# Patient Record
Sex: Male | Born: 1985 | Race: White | Hispanic: No | Marital: Single | Smoking: Current every day smoker
Health system: Southern US, Community
[De-identification: ages and names within clinical notes are randomized; demographics above are authoritative.]

---
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
## 2010-11-13 ENCOUNTER — Encounter: Payer: Self-pay | Admitting: Family Medicine

## 2015-01-28 ENCOUNTER — Emergency Department (INDEPENDENT_AMBULATORY_CARE_PROVIDER_SITE_OTHER)
Admission: EM | Admit: 2015-01-28 | Discharge: 2015-01-28 | Disposition: A | Discharge status: Home / Self Care | Payer: PRIVATE HEALTH INSURANCE | Source: Home / Self Care | Attending: Family Medicine | Admitting: Family Medicine

## 2015-01-28 ENCOUNTER — Emergency Department (INDEPENDENT_AMBULATORY_CARE_PROVIDER_SITE_OTHER): Payer: PRIVATE HEALTH INSURANCE

## 2015-01-28 DIAGNOSIS — S39012A Strain of muscle, fascia and tendon of lower back, initial encounter: Secondary | ICD-10-CM | POA: Diagnosis not present

## 2015-01-28 DIAGNOSIS — S161XXA Strain of muscle, fascia and tendon at neck level, initial encounter: Secondary | ICD-10-CM

## 2015-01-28 DIAGNOSIS — S40012A Contusion of left shoulder, initial encounter: Secondary | ICD-10-CM

## 2015-01-28 MED ORDER — DICLOFENAC SODIUM 75 MG PO TBEC: 75.0000 mg | DELAYED_RELEASE_TABLET | Freq: Two times a day (BID) | ORAL | Status: DC | PRN

## 2015-01-28 NOTE — Discharge Instructions (Signed)
Thank you for coming in today. Come back or go to the emergency room if you notice new weakness new numbness problems walking or bowel or bladder problems. Follow up with orthopedics and chiropractory as needed.   Back Exercises Back exercises help treat and prevent back injuries. The goal is to increase your strength in your belly (abdominal) and back muscles. These exercises can also help with flexibility. Start these exercises when told by your doctor. HOME CARE Back exercises include: Pelvic Tilt.  Lie on your back with your knees bent. Tilt your pelvis until the lower part of your back is against the floor. Hold this position 5 to 10 sec. Repeat this exercise 5 to 10 times. Knee to Chest.  Pull 1 knee up against your chest and hold for 20 to 30 seconds. Repeat this with the other knee. This may be done with the other leg straight or bent, whichever feels better. Then, pull both knees up against your chest. Sit-Ups or Curl-Ups.  Bend your knees 90 degrees. Start with tilting your pelvis, and do a partial, slow sit-up. Only lift your upper half 30 to 45 degrees off the floor. Take at least 2 to 3 seonds for each sit-up. Do not do sit-ups with your knees out straight. If partial sit-ups are difficult, simply do the above but with only tightening your belly (abdominal) muscles and holding it as told. Hip-Lift.  Lie on your back with your knees flexed 90 degrees. Push down with your feet and shoulders as you raise your hips 2 inches off the floor. Hold for 10 seconds, repeat 5 to 10 times. Back Arches.  Lie on your stomach. Prop yourself up on bent elbows. Slowly press on your hands, causing an arch in your low back. Repeat 3 to 5 times. Shoulder-Lifts.  Lie face down with arms beside your body. Keep hips and belly pressed to floor as you slowly lift your head and shoulders off the floor. Do not overdo your exercises. Be careful in the beginning. Exercises may cause you some mild back  discomfort. If the pain lasts for more than 15 minutes, stop the exercises until you see your doctor. Improvement with exercise for back problems is slow.  Document Released: 11/11/2010 Document Revised: 01/01/2012 Document Reviewed: 08/10/2011 [facility] Patient Information 2015 [place] [employer], [place]. This information is not intended to replace advice given to you by your health care provider. Make sure you discuss any questions you have with your health care provider.

## 2015-01-28 NOTE — ED Notes (Signed)
Pt triaged and assessed by provider.   Provider in before nurse. 
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
## 2022-02-19 ENCOUNTER — Encounter (HOSPITAL_COMMUNITY): Payer: Self-pay | Admitting: Emergency Medicine

## 2022-02-19 ENCOUNTER — Ambulatory Visit (HOSPITAL_COMMUNITY)
Admission: EM | Admit: 2022-02-19 | Discharge: 2022-02-19 | Disposition: A | Discharge status: Home / Self Care | Payer: PRIVATE HEALTH INSURANCE | Attending: Family Medicine | Admitting: Family Medicine

## 2022-02-19 DIAGNOSIS — J4541 Moderate persistent asthma with (acute) exacerbation: Secondary | ICD-10-CM

## 2022-02-19 MED ORDER — ALBUTEROL SULFATE HFA 108 (90 BASE) MCG/ACT IN AERS: 2.0000 | INHALATION_SPRAY | RESPIRATORY_TRACT | 0 refills | Status: AC | PRN

## 2022-02-19 MED ORDER — PREDNISONE 20 MG PO TABS: ORAL_TABLET | ORAL | 0 refills | Status: DC

## 2022-02-19 NOTE — ED Provider Notes (Signed)
?[facility] ? ? ? ?CSN: [id number] ?Arrival date & time: 02/19/22  1649 ? ? ?  ? ?History   ?Chief Complaint ?Chief Complaint  ?Patient presents with  ? Asthma exacerbation   ? ? ?HPI ?Antonio Cuevas is a 36 y.o. male.  ? ?Patient is here for asthma exacerbation.  ?He does have asthma.  He started with worsening symptoms about 3 days ago with severe cough, wheezing.  Worse in the morning and evening. He has a rescue inhaler, but using this more than he should.  He had a bad attack today during the day.  ?He is short on money. As a result he declines a nebulizer today.  ?He is requesting a refill of the inhaler, and a prednisone (at least 7 day).  ?Using mucinex without much help.  ?No fevers/chills.  No n/v.  ? ?History reviewed. No pertinent past medical history. ? ?There are no problems to display for this patient. ? ? ?History reviewed. No pertinent surgical history. ? ? ? ? ?Home Medications   ? ?Prior to Admission medications   ?Medication Sig Start Date End Date Taking? Authorizing Provider  ?diclofenac (VOLTAREN) 75 MG EC tablet Take 1 tablet (75 mg total) by mouth 2 (two) times daily as needed. 01/28/15   Rodolph Bong, MD  ? ? ?Family History ?History reviewed. No pertinent family history. ? ?Social History ?Social History  ? ?Tobacco Use  ? Smoking status: Former  ?  Types: Cigarettes  ? Smokeless tobacco: Never  ?Vaping Use  ? Vaping Use: Every day  ? ? ? ?Allergies   ?Patient has no known allergies. ? ? ?Review of Systems ?Review of Systems  ?Constitutional: Negative.   ?HENT: Negative.    ?Respiratory:  Positive for cough, shortness of breath and wheezing.   ?Cardiovascular: Negative.   ?Gastrointestinal: Negative.   ?Genitourinary: Negative.   ? ? ?Physical Exam ?Triage Vital Signs ?ED Triage Vitals  ?Enc Vitals Group  ?   BP 02/19/22 1707 115/61  ?   Pulse Rate 02/19/22 1707 74  ?   Resp 02/19/22 1707 (!) 22  ?   Temp 02/19/22 1707 97.8 ?F (36.6 ?C)  ?   Temp Source 02/19/22 1707 Oral  ?    SpO2 02/19/22 1707 97 %  ?   Weight 02/19/22 1704 170 lb (77.1 kg)  ?   Height 02/19/22 1704 5\' 6"  (1.676 m)  ?   Head Circumference --   ?   Peak Flow --   ?   Pain Score 02/19/22 1704 0  ?   Pain Loc --   ?   Pain Edu? --   ?   Excl. in GC? --   ? ?No data found. ? ?Updated Vital Signs ?BP 115/61 (BP Location: Left Arm)   Pulse 74   Temp 97.8 ?F (36.6 ?C) (Oral)   Resp (!) 22   Ht 5\' 6"  (1.676 m)   Wt 77.1 kg   SpO2 97%   BMI 27.44 kg/m?  ? ?Visual Acuity ?Right Eye Distance:   ?Left Eye Distance:   ?Bilateral Distance:   ? ?Right Eye Near:   ?Left Eye Near:    ?Bilateral Near:    ? ?Physical Exam ?Constitutional:   ?   General: He is not in acute distress. ?   Appearance: Normal appearance. He is not ill-appearing.  ?HENT:  ?   Head: Normocephalic.  ?   Nose: Nose normal.  ?Cardiovascular:  ?  Rate and Rhythm: Normal rate.  ?Pulmonary:  ?   Effort: Pulmonary effort is normal.  ?   Breath sounds: Wheezing present.  ?   Comments: Scattered wheezes throughout the lung space ?Musculoskeletal:  ?   Cervical back: Normal range of motion and neck supple.  ?Neurological:  ?   Mental Status: He is alert.  ? ? ? ?UC Treatments / Results  ?Labs ?(all labs ordered are listed, but only abnormal results are displayed) ?Labs Reviewed - No data to display ? ?EKG ? ? ?Radiology ?No results found. ? ?Procedures ?Procedures (including critical care time) ? ?Medications Ordered in UC ?Medications - No data to display ? ?Initial Impression / Assessment and Plan / UC Course  ?I have reviewed the triage vital signs and the nursing notes. ? ?Pertinent labs & imaging results that were available during my care of the patient were reviewed by me and considered in my medical decision making (see chart for details). ? ?Patient seen today for asthma exacerbation.  ?Vitals normal, does not appear in distress, but does have audible wheezing.  He declines a neb treatment today due to cost.  He is requesting only prednisone and a refill  of the albuterol.  ?Discussed a nebulizer, but he wants to check cost of other places before getting one here.  ? ?  ?Final Clinical Impressions(s) / UC Diagnoses  ? ?Final diagnoses:  ?Moderate persistent asthma with acute exacerbation  ? ? ? ?Discharge Instructions   ? ?  ?You were seen today for asthma exacerbation. ?I have sent out prednisone and an albuterol inhaler.  ?If you have worsening symptoms despite medication then please go to the ER for further evaluation.  ? ? ? ?ED Prescriptions   ? ? Medication Sig Dispense Auth. Provider  ? albuterol (VENTOLIN HFA) 108 (90 Base) MCG/ACT inhaler Inhale 2 puffs into the lungs every 4 (four) hours as needed for wheezing or shortness of breath. 1 each Jannifer Franklin, MD  ? predniSONE (DELTASONE) 20 MG tablet 3 tabs daily x 3 days, 2 tabs daily x 3 days, 1 tab daily x 3 days, 1/2 tab daily x 3 days 30 tablet Jannifer Franklin, MD  ? ?  ? ?PDMP not reviewed this encounter. ?  ?Jannifer Franklin, MD ?02/19/22 1734 ? ?

## 2022-02-19 NOTE — Discharge Instructions (Signed)
You were seen today for asthma exacerbation. ?I have sent out prednisone and an albuterol inhaler.  ?If you have worsening symptoms despite medication then please go to the ER for further evaluation.  ?

## 2022-02-19 NOTE — ED Triage Notes (Addendum)
Pt reports shortness of breath and wheezing. States he has been using his inhaler too much. Just got rx for inhaler 2 days ago and already have 50 puffs left. States normally he is prescribed 7 day taper dose of prednisone and z-pack. Pt able to speak in full sentences and sating 99% on room air.  ?

## 2023-07-22 ENCOUNTER — Emergency Department (HOSPITAL_COMMUNITY): Payer: Self-pay

## 2023-07-22 ENCOUNTER — Other Ambulatory Visit: Payer: Self-pay

## 2023-07-22 ENCOUNTER — Emergency Department (HOSPITAL_COMMUNITY)
Admission: EM | Admit: 2023-07-22 | Discharge: 2023-07-25 | Disposition: A | Discharge status: Home / Self Care | Payer: Self-pay | Attending: Emergency Medicine | Admitting: Emergency Medicine

## 2023-07-22 DIAGNOSIS — R079 Chest pain, unspecified: Secondary | ICD-10-CM | POA: Insufficient documentation

## 2023-07-22 DIAGNOSIS — F19959 Other psychoactive substance use, unspecified with psychoactive substance-induced psychotic disorder, unspecified: Secondary | ICD-10-CM

## 2023-07-22 DIAGNOSIS — R000 Tachycardia, unspecified: Secondary | ICD-10-CM | POA: Insufficient documentation

## 2023-07-22 DIAGNOSIS — R0682 Tachypnea, not elsewhere classified: Secondary | ICD-10-CM | POA: Insufficient documentation

## 2023-07-22 DIAGNOSIS — F29 Unspecified psychosis not due to a substance or known physiological condition: Secondary | ICD-10-CM | POA: Insufficient documentation

## 2023-07-22 DIAGNOSIS — F19150 Other psychoactive substance abuse with psychoactive substance-induced psychotic disorder with delusions: Secondary | ICD-10-CM | POA: Insufficient documentation

## 2023-07-22 DIAGNOSIS — Z87891 Personal history of nicotine dependence: Secondary | ICD-10-CM | POA: Insufficient documentation

## 2023-07-22 DIAGNOSIS — F22 Delusional disorders: Secondary | ICD-10-CM

## 2023-07-22 LAB — CBC
HCT: 43 % (ref 39.0–52.0)
Hemoglobin: 14 g/dL (ref 13.0–17.0)
MCH: 30 pg (ref 26.0–34.0)
MCHC: 32.6 g/dL (ref 30.0–36.0)
MCV: 92.1 fL (ref 80.0–100.0)
Platelets: 304 10*3/uL (ref 150–400)
RBC: 4.67 MIL/uL (ref 4.22–5.81)
RDW: 11.5 % (ref 11.5–15.5)
WBC: 7.4 10*3/uL (ref 4.0–10.5)
nRBC: 0 % (ref 0.0–0.2)

## 2023-07-22 LAB — URINALYSIS, ROUTINE W REFLEX MICROSCOPIC
Bilirubin Urine: NEGATIVE
Glucose, UA: NEGATIVE mg/dL
Hgb urine dipstick: NEGATIVE
Ketones, ur: NEGATIVE mg/dL
Leukocytes,Ua: NEGATIVE
Nitrite: NEGATIVE
Protein, ur: NEGATIVE mg/dL
Specific Gravity, Urine: 1.006 (ref 1.005–1.030)
pH: 7 (ref 5.0–8.0)

## 2023-07-22 LAB — BASIC METABOLIC PANEL
Anion gap: 13 (ref 5–15)
BUN: 7 mg/dL (ref 6–20)
CO2: 25 mmol/L (ref 22–32)
Calcium: 9.5 mg/dL (ref 8.9–10.3)
Chloride: 101 mmol/L (ref 98–111)
Creatinine, Ser: 1.01 mg/dL (ref 0.61–1.24)
GFR, Estimated: >60 mL/min (ref >60)
Glucose, Bld: 123 mg/dL — ABNORMAL HIGH (ref 70–99)
Potassium: 3.5 mmol/L (ref 3.5–5.1)
Sodium: 139 mmol/L (ref 135–145)

## 2023-07-22 LAB — RAPID URINE DRUG SCREEN, HOSP PERFORMED
Amphetamines: POSITIVE — AB
Barbiturates: NOT DETECTED
Benzodiazepines: NOT DETECTED
Cocaine: NOT DETECTED
Opiates: NOT DETECTED
Tetrahydrocannabinol: POSITIVE — AB

## 2023-07-22 LAB — SALICYLATE LEVEL: Salicylate Lvl: <7 mg/dL — ABNORMAL LOW (ref 7.0–30.0)

## 2023-07-22 LAB — TSH: TSH: 1.781 u[IU]/mL (ref 0.350–4.500)

## 2023-07-22 LAB — TROPONIN I (HIGH SENSITIVITY)
Troponin I (High Sensitivity): 3 ng/L (ref <18)
Troponin I (High Sensitivity): 3 ng/L (ref <18)

## 2023-07-22 LAB — ACETAMINOPHEN LEVEL: Acetaminophen (Tylenol), Serum: <10 ug/mL — ABNORMAL LOW (ref 10–30)

## 2023-07-22 LAB — ETHANOL: Alcohol, Ethyl (B): <10 mg/dL (ref <10)

## 2023-07-22 MED ORDER — LORAZEPAM 1 MG PO TABS: 1.0000 mg | ORAL_TABLET | ORAL | Status: DC | PRN

## 2023-07-22 MED ORDER — DROPERIDOL 2.5 MG/ML IJ SOLN
2.5000 mg | Freq: Once | INTRAMUSCULAR | Status: AC
  Administered 2023-07-22: 2.5 mg via INTRAVENOUS
  Filled 2023-07-22: qty 2

## 2023-07-22 MED ORDER — HYDROXYZINE HCL 25 MG PO TABS: 25.0000 mg | ORAL_TABLET | Freq: Three times a day (TID) | ORAL | Status: DC | PRN

## 2023-07-22 MED ORDER — OLANZAPINE 5 MG PO TBDP: 10.0000 mg | ORAL_TABLET | Freq: Three times a day (TID) | ORAL | Status: DC | PRN

## 2023-07-22 MED ORDER — SODIUM CHLORIDE 0.9 % IV BOLUS
1000.0000 mL | Freq: Once | INTRAVENOUS | Status: AC
  Administered 2023-07-22: 1000 mL via INTRAVENOUS

## 2023-07-22 MED ORDER — ZIPRASIDONE MESYLATE 20 MG IM SOLR: 20.0000 mg | INTRAMUSCULAR | Status: DC | PRN

## 2023-07-22 MED ORDER — LORAZEPAM 1 MG PO TABS: 1.0000 mg | ORAL_TABLET | Freq: Once | ORAL | Status: DC

## 2023-07-22 MED ORDER — OLANZAPINE 5 MG PO TBDP
10.0000 mg | ORAL_TABLET | Freq: Once | ORAL | Status: AC
  Administered 2023-07-22: 10 mg via ORAL
  Filled 2023-07-22: qty 2

## 2023-07-22 NOTE — ED Notes (Signed)
Pt provided water.  

## 2023-07-22 NOTE — ED Notes (Signed)
Per staffing, no 1:1 at this time but may have one at 1500; ED charge RN, Asher Muir, made aware of staffing shortage.

## 2023-07-22 NOTE — ED Notes (Signed)
Upon entering room pt was removing monitoring cords and trying to figure out why the monitor was alarming. RN informed pt the monitoring cords are not in place correctly. RN applied monitoring cords. When getting BP pt pull cuff off and stated it makes it feel like his head is going to explode. Pt agreed to have BP cuff placed on left wrist.

## 2023-07-22 NOTE — ED Notes (Signed)
Pt returned from restroom stating he feels better. RN and mom redirected pt to agree to stay and start IV fluids.

## 2023-07-22 NOTE — ED Notes (Signed)
Pt agreed to complete CT scan

## 2023-07-22 NOTE — ED Notes (Signed)
Pt says he has blood rushing to his right leg and heart beating fast that started about 5 am.

## 2023-07-22 NOTE — ED Notes (Signed)
RN changed pt out in purple scrubs. RN gave pt MOM belongings which included burgundy sweatshirt, olive green pants, black shoes, socks, and orange vape.
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
## 2023-07-22 NOTE — ED Notes (Signed)
Pt mom says she will stay in lobby d/t

## 2023-07-22 NOTE — ED Notes (Signed)
RN explained the IVC process with sister. Sister is assisting with keeping pt calm and redirecting to stay at the hospital at this time.

## 2023-07-22 NOTE — ED Notes (Signed)
Pt provided a breakfast tray.

## 2023-07-22 NOTE — Consult Note (Signed)
Telepsych Consultation   Reason for Consult:  "Psych Consult" Referring Physician:  [place]  Location of Patient:    [facility] Location of Provider: Other: virtual home office  Patient Identification: Antonio Cuevas MRN:  [id number] Principal Diagnosis: Substance-induced psychotic disorder [facility]) Diagnosis:  Principal Problem:   Substance-induced psychotic disorder (HCC)   Total Time spent with patient: 45 minutes  Subjective:   Antonio Cuevas is a 37 y.o. male patient admitted with  Per RN Triage Note 07/21/2028@0618 : "Patient reports palpitations with central chest discomfort this morning , he adds headache with right lower leg pressure, denies SOB "  Per IVC" "Patient has hx of anxiety, depression and undiagnosed psychotic disorder and during last episode of psychosis patient listened to voices in his head telling him to cut his writsts and question drug use; patient endorses acid flashbacks."  HPI:   Antonio Cuevas, 37 y.o., male patient presented to the emergency department for evaluation of chest pain; found to be uncooperative and nervous, after receiving collateral from his family that last time patient has similar behavior he was hearing voices telling him to cut his wrists; patient was placed under IVC and psychiatry consult was ordered.  Patient seen via telepsych by this provider; chart reviewed and consulted with Dr. Sherron Flemings on 07/22/23.  On evaluation Antonio Cuevas is observed sitting in an exam room, alone.  He is fairly groomed, wearing hospital scrubs and has a blanket draped around him.  Pt greeted and given anticipatory guidance by this Clinical research associate.  He does not appear interested in what's being said, but is preoccupied with his blanket. Patient constantly fidgeting with his blanket, then turns his attention to his arm, and begins picking at the area where he had blood drawn.  When asked if his arm bothers him, pt extends his right arm and  states, "see, nothing wrong with it."  He denies pain or anything this writer could address but remains focused on fidgeting with various items of clothing as well.  When asked if he's nervous he states maybe his legs are nervous because he can't stop moving them.  Patient is alert and oriented x2; he is not sure of the time and states he stopped keeping track of the date and time a while ago because he no longer found it useful.  He does not elaborate; when asked what's meant by the statement, he's observed looking around the room.    Patient reports a hx for anxiety but states he is undiagnosed and is not being followed by outpatient mental health.  He states his PCP prescribes amphetamine/dextroamphetamine XR 30mg  po daily for adhd.  He reports he take the medication as prescribed and denies taking more; Pt states he believes he has a full bottle at home. Per PDMP medication was last filled on 07/05/2023 for 30 days supply.  He states he barely ever drinks alcohol but occasionally may have a beer or so; he denies street drug usage.  He then announces his uncomfortable in his seat and stands, looks around the room before returning to sit. He denies SI/HI/AVH, he denies self injurious behaviors or prior suicide attempt.  He denies prior psychiatric hospitalization.  Per IVC, pt has hx of hearing voices telling him to cut his wrist, wen directly asked about this, he pauses before saying "that's not true."   Patient states he is single and currently lives with sister; States he's not working but depends on his mother and  sister to care for him.   He reports over the past few weeks he's had difficulty sleeping; he states he eats a few times a day. He denies concerns this Clinical research associate could address.    Labs: CMP is within normal limits except glucose which is elevated at 123mg /dl-non-fasting labs CBC does not show anemia or leukocytosis UDS is + for amphetamines and thc; pt receives prescribed adderall EKG: QT/QTC  intervals 367/439 Head CT IMPRESSION: Normal noncontrast Head CT.   During evaluation Antonio Cuevas is sitting on chair in exam room; He is fairly groomed wearing hospital scrubs; He is alert/oriented x 2; appears nervous and confused, attempts to cooperate but is limited by his mental state; and mood congruent with affect.  Patient is speaking in a clear tone at moderate volume, and normal pace; with fair eye contact.  His thought process is relevant; Patient appears to be responding to internal/external stimuli, has poor judgment and insight. Patient denies suicidal/self-harm/homicidal ideation Patient has remained calm throughout assessment and has answered questions appropriately.    Per Ed Provider Admission Assessment 07/22/2023@0609 : History       Chief Complaint  Patient presents with   Palpitations   Chest Pain      Headache      Antonio Cuevas is a 37 y.o. male past medical history seen for anxiety, depression who presents with concern for central chest discomfort, palpitations, endorses some right lower leg pressure, headache, denies leg swelling, previous history of blood clot, hemoptysis.  Patient is quite anxious, refusing EKG on my initial evaluation, anxious, pressured speech. Per sister no previous hx of drug use, bipolar, schizophrenia.    Past Psychiatric History: ADHD,   Risk to Self:  yes d/t decompensated mental state Risk to Others:  yes, there is potential d/t decompensated mental state Prior Inpatient Therapy:  denies Prior Outpatient Therapy:  denies  Past Medical History: No past medical history on file. No past surgical history on file. Family History: No family history on file. Family Psychiatric  History: denies Social History:  Social History   Substance and Sexual Activity  Alcohol Use None     Social History   Substance and Sexual Activity  Drug Use Not on file    Social History   Socioeconomic History   Marital status: Single     Spouse name: Not on file   Number of children: Not on file   Years of education: Not on file   Highest education level: Not on file  Occupational History   Not on file  Tobacco Use   Smoking status: Former    Types: Cigarettes   Smokeless tobacco: Never  Vaping Use   Vaping status: Every Day  Substance and Sexual Activity   Alcohol use: Not on file   Drug use: Not on file   Sexual activity: Not on file  Other Topics Concern   Not on file  Social History Narrative   Not on file   Social Determinants of Health   Financial Resource Strain: Not on File (02/09/2022)   Received from [facility]    Financial Resource Strain: 0  Food Insecurity: Not on File (07/19/2023)   Received from [facility] Insecurity    Food: 0  Transportation Needs: Not on File (02/09/2022)   Received from [employer] Needs    Transportation: 0  Physical Activity: Not on File (02/09/2022)   Received from [facility]   Physical Activity  Physical Activity: 0  Stress: Not on File (02/09/2022)   Received from [facility]   Stress    Stress: 0  Social Connections: Not on File (07/07/2023)   Received from [facility]   Social Connections    Connectedness: 0   Additional Social History:    Allergies:   Allergies  Allergen Reactions   Penicillins     Other Reaction(s): childhood    Labs:  Results for orders placed or performed during the hospital encounter of 07/22/23 (from the past 48 hour(s))  Basic metabolic panel     Status: Abnormal   Collection Time: 07/22/23  6:22 AM  Result Value Ref Range   Sodium 139 135 - 145 mmol/L   Potassium 3.5 3.5 - 5.1 mmol/L   Chloride 101 98 - 111 mmol/L   CO2 25 22 - 32 mmol/L   Glucose, Bld 123 (H) 70 - 99 mg/dL    Comment: Glucose reference range applies only to samples taken after fasting for at least 8 hours.   BUN 7 6 - 20 mg/dL   Creatinine, Ser 1.61 0.61 - 1.24 mg/dL   Calcium 9.5 8.9 - 09.6 mg/dL   GFR, Estimated >04 >54 mL/min     Comment: (NOTE) Calculated using the CKD-EPI Creatinine Equation (2021)    Anion gap 13 5 - 15    Comment: Performed at [facility], [address], [place], [place] [address]  CBC     Status: None   Collection Time: 07/22/23  6:22 AM  Result Value Ref Range   WBC 7.4 4.0 - 10.5 K/uL   RBC 4.67 4.22 - 5.81 MIL/uL   Hemoglobin 14.0 13.0 - 17.0 g/dL   HCT 91.4 78.2 - 95.6 %   MCV 92.1 80.0 - 100.0 fL   MCH 30.0 26.0 - 34.0 pg   MCHC 32.6 30.0 - 36.0 g/dL   RDW 21.3 08.6 - 57.8 %   Platelets 304 150 - 400 K/uL   nRBC 0.0 0.0 - 0.2 %    Comment: Performed at [facility], [address], [place], [place] [address]  Troponin I (High Sensitivity)     Status: None   Collection Time: 07/22/23  6:22 AM  Result Value Ref Range   Troponin I (High Sensitivity) 3 <18 ng/L    Comment: (NOTE) Elevated high sensitivity troponin I (hsTnI) values and significant  changes across serial measurements may suggest ACS but many other  chronic and acute conditions are known to elevate hsTnI results.  Refer to the "Links" section for chest pain algorithms and additional  guidance. Performed at [facility], [address], [place], [place] [address]   Rapid urine drug screen (hospital performed)     Status: Abnormal   Collection Time: 07/22/23  8:16 AM  Result Value Ref Range   Opiates NONE DETECTED NONE DETECTED   Cocaine NONE DETECTED NONE DETECTED   Benzodiazepines NONE DETECTED NONE DETECTED   Amphetamines POSITIVE (A) NONE DETECTED   Tetrahydrocannabinol POSITIVE (A) NONE DETECTED   Barbiturates NONE DETECTED NONE DETECTED    Comment: (NOTE) DRUG SCREEN FOR MEDICAL PURPOSES ONLY.  IF CONFIRMATION IS NEEDED FOR ANY PURPOSE, NOTIFY LAB WITHIN 5 DAYS.  LOWEST DETECTABLE LIMITS FOR URINE DRUG SCREEN Drug Class                     Cutoff (ng/mL) Amphetamine and metabolites    1000 Barbiturate and metabolites    200 Benzodiazepine  200 Opiates and  metabolites        300 Cocaine and metabolites        300 THC                            50 Performed at [facility], [address] [place], [place] [address]   Urinalysis, Routine w reflex microscopic -Urine, Clean Catch     Status: None   Collection Time: 07/22/23  8:16 AM  Result Value Ref Range   Color, Urine YELLOW YELLOW   APPearance CLEAR CLEAR   Specific Gravity, Urine 1.006 1.005 - 1.030   pH 7.0 5.0 - 8.0   Glucose, UA NEGATIVE NEGATIVE mg/dL   Hgb urine dipstick NEGATIVE NEGATIVE   Bilirubin Urine NEGATIVE NEGATIVE   Ketones, ur NEGATIVE NEGATIVE mg/dL   Protein, ur NEGATIVE NEGATIVE mg/dL   Nitrite NEGATIVE NEGATIVE   Leukocytes,Ua NEGATIVE NEGATIVE    Comment: Performed at [facility], [address] [place], [place] [address]  Acetaminophen level     Status: Abnormal   Collection Time: 07/22/23  8:17 AM  Result Value Ref Range   Acetaminophen (Tylenol), Serum <10 (L) 10 - 30 ug/mL    Comment: (NOTE) Therapeutic concentrations vary significantly. A range of 10-30 ug/mL  may be an effective concentration for many patients. However, some  are best treated at concentrations outside of this range. Acetaminophen concentrations >150 ug/mL at 4 hours after ingestion  and >50 ug/mL at 12 hours after ingestion are often associated with  toxic reactions.  Performed at [facility], [address], [place], [place] [address]   Salicylate level     Status: Abnormal   Collection Time: 07/22/23  8:17 AM  Result Value Ref Range   Salicylate Lvl <7.0 (L) 7.0 - 30.0 mg/dL    Comment: Performed at [facility], [address], [place], [place] [address]  Ethanol     Status: None   Collection Time: 07/22/23  8:17 AM  Result Value Ref Range   Alcohol, Ethyl (B) <10 <10 mg/dL    Comment: (NOTE) Lowest detectable limit for serum alcohol is 10 mg/dL.  For medical purposes only. Performed at [facility], [address] [place],  [place] [address]   Troponin I (High Sensitivity)     Status: None   Collection Time: 07/22/23  8:17 AM  Result Value Ref Range   Troponin I (High Sensitivity) 3 <18 ng/L    Comment: (NOTE) Elevated high sensitivity troponin I (hsTnI) values and significant  changes across serial measurements may suggest ACS but many other  chronic and acute conditions are known to elevate hsTnI results.  Refer to the "Links" section for chest pain algorithms and additional  guidance. Performed at [facility], [address] [place], [place] [address]   TSH     Status: None   Collection Time: 07/22/23  8:17 AM  Result Value Ref Range   TSH 1.781 0.350 - 4.500 uIU/mL    Comment: Performed by a 3rd Generation assay with a functional sensitivity of <=0.01 uIU/mL. Performed at [facility], [address], [place], [place] [address]     Medications:  Current Facility-Administered Medications  Medication Dose Route Frequency Provider Last Rate Last Admin   hydrOXYzine (ATARAX) tablet 25 mg  25 mg Oral TID PRN Chales Abrahams, NP  Current Outpatient Medications  Medication Sig Dispense Refill   albuterol (VENTOLIN HFA) 108 (90 Base) MCG/ACT inhaler Inhale 2 puffs into the lungs every 4 (four) hours as needed for wheezing or shortness of breath. 1 each 0   amphetamine-dextroamphetamine (ADDERALL XR) 30 MG 24 hr capsule Take 30 mg by mouth every morning.     diphenhydrAMINE HCl, Sleep, (SLEEP-AID) 50 MG CAPS Take 100 mg by mouth at bedtime as needed (sleep).     doxylamine, Sleep, (UNISOM) 25 MG tablet Take 25 mg by mouth at bedtime as needed for sleep.     magnesium oxide (MAG-OX) 400 (240 Mg) MG tablet Take 400 mg by mouth daily.     Melatonin 10-10 MG TBCR Take 10 mg by mouth daily.      Musculoskeletal: pt moves all extremities and observed ambulating in the exam room independently. Strength & Muscle Tone: within normal limits Gait & Station: normal Patient leans:  N/A     Psychiatric Specialty Exam:  Presentation  General Appearance: Bizarre (patient preoocupped looking around the room and fidgeting with his arms and clothings)  Eye Contact:Minimal  Speech:Normal Rate  Speech Volume:Normal  Handedness:Right   Mood and Affect  Mood:Anxious; Dysphoric  Affect:Congruent; Blunt   Thought Process  Thought Processes:Irrevelant  Descriptions of Associations:Intact  Orientation:Partial  Thought Content:Illogical  History of Schizophrenia/Schizoaffective disorder:No data recorded Duration of Psychotic Symptoms:No data recorded Hallucinations:Hallucinations: -- (patient denies but appears to be responding to internal stimulus)  Ideas of Reference:Paranoia  Suicidal Thoughts:Suicidal Thoughts: No  Homicidal Thoughts:Homicidal Thoughts: No   Sensorium  Memory:Immediate Fair; Recent Fair; Remote Fair  Judgment:Poor  Insight:Poor   Executive Functions  Concentration:Poor  Attention Span:Poor  Recall:Fair  Fund of Knowledge:Fair  Language:Good   Psychomotor Activity  Psychomotor Activity:Psychomotor Activity: Increased (patient figdeting with his arms, clothing and blanket;)   Assets  Assets:Desire for Improvement; Housing; Social Support   Sleep  Sleep:Sleep: Fair Number of Hours of Sleep: 4    Physical Exam: Physical Exam Vitals and nursing note reviewed.  Cardiovascular:     Rate and Rhythm: Normal rate.     Pulses: Normal pulses.  Pulmonary:     Effort: Pulmonary effort is normal.  Musculoskeletal:        General: Normal range of motion.  Neurological:     Mental Status: He is alert. He is disoriented.  Psychiatric:        Attention and Perception: He is inattentive. He perceives auditory hallucinations.        Mood and Affect: Mood is anxious. Affect is blunt.        Speech: Speech normal.        Behavior: Behavior is withdrawn.        Thought Content: Thought content is paranoid. Thought  content does not include homicidal or suicidal ideation. Thought content does not include homicidal or suicidal plan.        Cognition and Memory: Cognition is impaired. Memory is impaired.        Judgment: Judgment is impulsive and inappropriate.    Review of Systems  Constitutional: Negative.   HENT: Negative.    Eyes: Negative.   Respiratory: Negative.    Cardiovascular: Negative.   Gastrointestinal: Negative.   Genitourinary: Negative.   Musculoskeletal: Negative.   Skin: Negative.   Neurological: Negative.   Endo/Heme/Allergies: Negative.   Psychiatric/Behavioral:  Positive for hallucinations. The patient is nervous/anxious and has insomnia.    Blood pressure 119/80, pulse 79, temperature 98.3 F (36.8 C),  resp. rate 17, height 5\' 6"  (1.676 m), weight 72.6 kg, SpO2 99%. Body mass index is 25.82 kg/m.  Treatment Plan Summary: Pt who initially presented to the emergency department for evaluation of palpitations, chest pain and headache; on initial assessment, he was found to be nervous, with pressured speech and refusing EKG; After receiving collateral from his family that the last time patient behaved this way, he had command hallucinations telling him to cut his wrist, the emergency room provider completed IVC paperwork on patient.  Patient has no hx for prior mental health dx, recently head CT was negative for acute findings to explain his presentation.  On psychiatric assessment today, patient is very guarded, observed constantly fidgeting and looking around the room.  He denies AVH, but appears to be responding to internal stimulus, and too preoccupied to fully participate in the assessment. I told him I was recommending he remain overnight for monitoring and am psychiatric reassessment. Also informed him I would order olanzapine to help clear his thoughts and hydroxyzine as needed for anxiety.  He was encouraged to cooperate with staff who are trying to help him and informed he  would be reassessed in the AM.  Patient given the opportunity to ask questions.  Above discussed with patient concordance.   EKG does not show prolonged QT intervals  Start: Olanzapine 10mg  po x1 dose for psychosis Hydroxyzine 25mg  po TID prn anxiety Agitation protocol ordered  Disposition: Patient is recommended for overnight observation and AM psychiatric reassessment.  He may be appropriate for transfer [facility] for observation if they have available beds.   Dr. Anders Simmonds; Jess Barters were all informed of above recommendation and disposition via secure chat at 1541.   This service was provided via telemedicine using a 2-way, interactive audio and video technology.  Names of all persons participating in this telemedicine service and their role in this encounter. Name: Sylvanus Telford  Role: Patient   Name: Ophelia Shoulder Role: PMHNP  Name: Harrold Donath Massengill Role: Psychiatrist   Chales Abrahams, NP 07/22/2023 6:33 PM

## 2023-07-22 NOTE — ED Notes (Signed)
Mother and sister are very concerned with pt finding out they IVC'd him. The family hopes things can be kept confidential d/t the relationship will be strained and son will not speak to them again.

## 2023-07-22 NOTE — ED Triage Notes (Signed)
Patient reports palpitations with central chest discomfort this morning , he adds headache with right lower leg pressure, denies SOB .

## 2023-07-22 NOTE — ED Notes (Signed)
Nursing student attempted to place an IV. When she removed the tape from his arm pt sat up abruptly and started wiping his arm as if he was removing something. Pt then stated he needed to use restroom. RN supplied a urine cup and directed pt to restroom.

## 2023-07-22 NOTE — ED Notes (Signed)
RN gave the family update on the process of IVC. She said he has paranoia of government officials. They would like to de-escalate before we have [employer] or security step in.

## 2023-07-22 NOTE — ED Notes (Signed)
Pt ambulated to restroom without incident.

## 2023-07-22 NOTE — ED Notes (Signed)
CT stated pt jumped off table stating he got to go. CT told him to lay back down and brought to the room.

## 2023-07-22 NOTE — ED Provider Notes (Signed)
[facility] EMERGENCY DEPARTMENT AT [facility] Provider Note   CSN: [id number] Arrival date & time: 07/22/23  1027     History  Chief Complaint  Patient presents with   Palpitations   Chest Pain    Headache    Antonio Cuevas is a 37 y.o. male past medical history seen for anxiety, depression who presents with concern for central chest discomfort, palpitations, endorses some right lower leg pressure, headache, denies leg swelling, previous history of blood clot, hemoptysis.  Patient is quite anxious, refusing EKG on my initial evaluation, anxious, pressured speech. Per sister no previous hx of drug use, bipolar, schizophrenia.    Palpitations Associated symptoms: chest pain   Chest Pain Associated symptoms: palpitations        Home Medications Prior to Admission medications   Medication Sig Start Date End Date Taking? Authorizing Provider  albuterol (VENTOLIN HFA) 108 (90 Base) MCG/ACT inhaler Inhale 2 puffs into the lungs every 4 (four) hours as needed for wheezing or shortness of breath. 02/19/22  Yes Piontek, Erin, MD  amphetamine-dextroamphetamine (ADDERALL XR) 30 MG 24 hr capsule Take 30 mg by mouth every morning.   Yes [provider]  diphenhydrAMINE HCl, Sleep, (SLEEP-AID) 50 MG CAPS Take 100 mg by mouth at bedtime as needed (sleep).   Yes [provider]  doxylamine, Sleep, (UNISOM) 25 MG tablet Take 25 mg by mouth at bedtime as needed for sleep.   Yes [provider]  magnesium oxide (MAG-OX) 400 (240 Mg) MG tablet Take 400 mg by mouth daily.   Yes [provider]  Melatonin 10-10 MG TBCR Take 10 mg by mouth daily.   Yes [provider]      Allergies    Penicillins    Review of Systems   Review of Systems  Cardiovascular:  Positive for chest pain and palpitations.  All other systems reviewed and are negative.   Physical Exam Updated Vital Signs BP 119/80   Pulse 79   Temp 98.3 F (36.8 C) (Oral)    Resp 17   Ht 5\' 6"  (1.676 m)   Wt 72.6 kg   SpO2 99%   BMI 25.82 kg/m  Physical Exam Vitals and nursing note reviewed.  Constitutional:      General: He is not in acute distress.    Appearance: Normal appearance.  HENT:     Head: Normocephalic and atraumatic.  Eyes:     General:        Right eye: No discharge.        Left eye: No discharge.  Cardiovascular:     Rate and Rhythm: Regular rhythm. Tachycardia present.     Heart sounds: No murmur heard.    No friction rub. No gallop.  Pulmonary:     Effort: Pulmonary effort is normal. Tachypnea present.     Breath sounds: Normal breath sounds.  Abdominal:     General: Bowel sounds are normal.     Palpations: Abdomen is soft.  Skin:    General: Skin is warm and dry.     Capillary Refill: Capillary refill takes less than 2 seconds.  Neurological:     Mental Status: He is alert and oriented to person, place, and time.  Psychiatric:        Mood and Affect: Mood normal.        Behavior: Behavior normal.     ED Results / Procedures / Treatments   Labs (all labs ordered are listed, but only  abnormal results are displayed) Labs Reviewed  BASIC METABOLIC PANEL - Abnormal; Notable for the following components:      Result Value   Glucose, Bld 123 (*)    All other components within normal limits  ACETAMINOPHEN LEVEL - Abnormal; Notable for the following components:   Acetaminophen (Tylenol), Serum <10 (*)    All other components within normal limits  SALICYLATE LEVEL - Abnormal; Notable for the following components:   Salicylate Lvl <7.0 (*)    All other components within normal limits  RAPID URINE DRUG SCREEN, HOSP PERFORMED - Abnormal; Notable for the following components:   Amphetamines POSITIVE (*)    Tetrahydrocannabinol POSITIVE (*)    All other components within normal limits  CBC  ETHANOL  URINALYSIS, ROUTINE W REFLEX MICROSCOPIC  TSH  TROPONIN I (HIGH SENSITIVITY)  TROPONIN I (HIGH SENSITIVITY)    EKG EKG  Interpretation Date/Time:  Sunday July 22 2023 07:24:12 EDT Ventricular Rate:  86 PR Interval:  124 QRS Duration:  83 QT Interval:  367 QTC Calculation: 439 R Axis:   58  Text Interpretation: Sinus rhythm ST elev, probable normal early repol pattern Confirmed by Anders Simmonds [phone]) on 07/22/2023 7:34:04 AM  Radiology CT Head Wo Contrast  Result Date: 07/22/2023 CLINICAL DATA:  37 year old male with altered mental status. Palpitations, chest discomfort, headache, right lower leg pressure. EXAM: CT HEAD WITHOUT CONTRAST TECHNIQUE: Contiguous axial images were obtained from the base of the skull through the vertex without intravenous contrast. RADIATION DOSE REDUCTION: This exam was performed according to the departmental dose-optimization program which includes automated exposure control, adjustment of the mA and/or kV according to patient size and/or use of iterative reconstruction technique. COMPARISON:  None Available. FINDINGS: Brain: Normal cerebral volume. No midline shift, ventriculomegaly, mass effect, evidence of mass lesion, intracranial hemorrhage or evidence of cortically based acute infarction. Gray-white matter differentiation is within normal limits throughout the brain. Vascular: No suspicious intracranial vascular hyperdensity. Skull: Negative. Sinuses/Orbits: Visualized paranasal sinuses and mastoids are clear. Other: Visualized orbits and scalp soft tissues are within normal limits. IMPRESSION: Normal noncontrast Head CT. Electronically Signed   By: Odessa Fleming M.D.   On: 07/22/2023 10:04   DG Chest 2 View  Result Date: 07/22/2023 CLINICAL DATA:  Chest pain and palpitations. EXAM: CHEST - 2 VIEW COMPARISON:  05/31/2005 FINDINGS: The heart size and mediastinal contours are within normal limits. Both lungs are clear. The visualized skeletal structures are unremarkable. IMPRESSION: No active cardiopulmonary disease. Electronically Signed   By: Signa Kell M.D.   On: 07/22/2023  07:25    Procedures Procedures    Medications Ordered in ED Medications  sodium chloride 0.9 % bolus 1,000 mL (0 mLs Intravenous Stopped 07/22/23 0929)  droperidol (INAPSINE) 2.5 MG/ML injection 2.5 mg (2.5 mg Intravenous Given 07/22/23 8657)    ED Course/ Medical Decision Making/ A&P Clinical Course as of 07/22/23 1143  Sun Jul 22, 2023  1028 Medically cleared at this time for TTS eval [CP]    Clinical Course User Index [CP] Olene Floss, PA-C                                 Medical Decision Making Amount and/or Complexity of Data Reviewed Labs: ordered.  Risk Prescription drug management.   Patient is a 37 y.o. male  who presents to the emergency department for psychiatric complaint, chest pain, elevated heart rate, anxiety, hallucinations  Past Medical  History: Anxiety, depression  Physical Exam: Patient with paranoid delusions, somewhat pressured speech, initially with significant tachycardia which improved when patient  Labs: Medical clearance labs as well as chest pain labs ordered, with following pertinent results:I independently interpreted plain, chest x-ray shows no evidence of acute intrathoracic abnormality.  CT of the head with no evidence of acute intracranial abnormality.  BMP overall unremarkable, mildly elevated glucose at 123 on nonfasting lab values.  UDS positive for amphetamines, he endorses Adderall use at home, [facility] also positive.  Probably negative alcohol, acetaminophen, salicylate levels.  Normal troponin x 1.  UA unremarkable.  EKG was sinus tachycardia initially.  Improved to normal sinus rhythm after patient resting in bed.  Cardiac monitoring: EKG obtained and interpreted by attending physician which shows: sinus tachycardia  Medications: I ordered medication including fluid bolus, droperidol for anxiety, agitation, tachycardia. I have reviewed the patients home medicines and have made adjustments as needed.  Disposition: Patient is  otherwise medically cleared at this time pending medical clearance laboratory evaluation. Will consult TTS and appreciate their recommendations.  I discussed this case with my attending physician Dr. Andria Meuse who cosigned this note including patient's presenting symptoms, physical exam, and planned diagnostics and interventions. Attending physician stated agreement with plan or made changes to plan which were implemented.   Final Clinical Impression(s) / ED Diagnoses Final diagnoses:  None    Rx / DC Orders ED Discharge Orders     None         Olene Floss, PA-C 07/22/23 1143    Anders Simmonds T, DO 07/23/23 [phone]

## 2023-07-23 MED ORDER — RISPERIDONE 1 MG PO TBDP
1.0000 mg | ORAL_TABLET | Freq: Two times a day (BID) | ORAL | Status: DC
  Administered 2023-07-23 – 2023-07-25 (×5): 1 mg via ORAL
  Filled 2023-07-23 (×5): qty 1

## 2023-07-23 MED ORDER — OLANZAPINE 5 MG PO TBDP: 10.0000 mg | ORAL_TABLET | Freq: Three times a day (TID) | ORAL | Status: DC | PRN

## 2023-07-23 MED ORDER — ZIPRASIDONE MESYLATE 20 MG IM SOLR: 20.0000 mg | INTRAMUSCULAR | Status: DC | PRN

## 2023-07-23 MED ORDER — LORAZEPAM 1 MG PO TABS
2.0000 mg | ORAL_TABLET | Freq: Four times a day (QID) | ORAL | Status: DC | PRN
  Administered 2023-07-23: 2 mg via ORAL
  Filled 2023-07-23: qty 2

## 2023-07-23 MED ORDER — LORAZEPAM 1 MG PO TABS: 2.0000 mg | ORAL_TABLET | ORAL | Status: DC | PRN

## 2023-07-23 MED ORDER — ZIPRASIDONE MESYLATE 20 MG IM SOLR: 20.0000 mg | Freq: Two times a day (BID) | INTRAMUSCULAR | Status: DC | PRN

## 2023-07-23 NOTE — Progress Notes (Cosign Needed Addendum)
[facility] Psych ED Progress Note  07/23/2023 12:07 PM Antonio Cuevas  MRN:  [id number]   Subjective:   Patient seen at [facility], ED for face-to-face psychiatric reevaluation.  Patient appears anxious, withdrawn, guarded, and very minimal with assessment.  He denies any accusations listed in his chart such as having auditory hallucinations, paranoia, hearing voices telling him to cut his wrist, or any psychiatric complaints.  He denies SI/HI/AVH.  Denies any suicide attempts.  Does admit to some marijuana use and previous LSD use.  States he has not used LSD in at least 6 months.  Patient states he has no psychiatric history and nothing is mentally wrong with him.  Patient is very discharge focused and wants to leave.  Patient did give me permission to contact his sister whom he lives with.  I called his sister, Farhan Jean, at [phone] and she added her mother to the call.  This was a very lengthy call with collateral information about the patient's history.  In summary patient has never officially been diagnosed with any psychiatric illness due to refusing to go to doctors.  Over the past couple years they have noticed the patient become increasingly isolated, does not really speak to any friends and only speaks to his sister and mother.  He is unable to work, has been unemployed for over a year and a half.  He constantly talks about the government having access to his thoughts.  He believes his mother and sister are also controlled by the government and he does not believe that what they say to him is true.  In July he had a "break" and was so fearful that someone was controlling him or watching him that he did make multiple self injurious lacerations on his arm.  He deleted all social media barely uses his phone.  He is fearful all the time that the government or someone is trying to hurt his family.  They felt like he is always been paranoid, protective, and sometimes isolative however it has  drastically worsened over the past year.  They are aware of his marijuana and LSD use however they do not notice it become worse when he is using the substances.  They do think his biological dad had a bipolar disorder and alcohol addiction however he was never officially diagnosed either.  They have not spoken to the father in a while, he is out of their lives.  They are worried about the patient and do not feel safe with him coming home.  They also mention he is not aggressive and has never tried to hurt anyone, however they are worried with him possibly having hallucinations to hurt himself as they did mention he said he heard voices telling him to cut his wrist recently.  They said he is very afraid of being diagnosed with anything mental illness stating able to ruin his life if that is on his records.  They said he will probably deny everything and do everything he can to go home however there wanting to get this information out there so we can properly treat what is going on with him.  Based on presentation yesterday which was rapid speech, paranoia, erratic behavior and with the collateral mention above will recommend IVC and inpatient psychiatric treatment.  Will start risperidone 1 mg twice daily.  Principal Problem: Psychosis (HCC) Diagnosis:  Principal Problem:   Psychosis [facility])   ED Assessment Time Calculation: Start Time: 1100 Stop Time: 1130 Total Time in Minutes (  Assessment Completion): 30   Past Psychiatric History:  denies  [place] Scale:  Flowsheet Row ED from 07/22/2023 in [facility] Emergency Department at [facility] ED from 02/19/2022 in [facility] Urgent Care at [facility] RISK CATEGORY No Risk No Risk       Past Medical History: No past medical history on file. No past surgical history on file. Family History: No family history on file. Family Psychiatric  History: possible bipolar for bio father Social History:  Social History   Substance and  Sexual Activity  Alcohol Use None     Social History   Substance and Sexual Activity  Drug Use Not on file    Social History   Socioeconomic History   Marital status: Single    Spouse name: Not on file   Number of children: Not on file   Years of education: Not on file   Highest education level: Not on file  Occupational History   Not on file  Tobacco Use   Smoking status: Former    Types: Cigarettes   Smokeless tobacco: Never  Vaping Use   Vaping status: Every Day  Substance and Sexual Activity   Alcohol use: Not on file   Drug use: Not on file   Sexual activity: Not on file  Other Topics Concern   Not on file  Social History Narrative   Not on file   Social Determinants of Health   Financial Resource Strain: Not on File (02/09/2022)   Received from [employer]    Financial Resource Strain: 0  Food Insecurity: Not on File (07/19/2023)   Received from [employer] Insecurity    Food: 0  Transportation Needs: Not on File (02/09/2022)   Received from [employer] Needs    Transportation: 0  Physical Activity: Not on File (02/09/2022)   Received from [facility]   Physical Activity    Physical Activity: 0  Stress: Not on File (02/09/2022)   Received from [facility]   Stress    Stress: 0  Social Connections: Not on File (07/07/2023)   Received from [employer]   Social Connections    Connectedness: 0    Sleep: Fair  Appetite:  Fair  Current Medications: Current Facility-Administered Medications  Medication Dose Route Frequency Provider Last Rate Last Admin   hydrOXYzine (ATARAX) tablet 25 mg  25 mg Oral TID PRN Chales Abrahams, NP       OLANZapine zydis (ZYPREXA) disintegrating tablet 10 mg  10 mg Oral Q8H PRN Eligha Bridegroom, NP       And   LORazepam (ATIVAN) tablet 2 mg  2 mg Oral Q6H PRN Eligha Bridegroom, NP       And   ziprasidone (GEODON) injection 20 mg  20 mg Intramuscular Q12H PRN Eligha Bridegroom, NP       risperiDONE (RISPERDAL  M-TABS) disintegrating tablet 1 mg  1 mg Oral BID Eligha Bridegroom, NP       Current Outpatient Medications  Medication Sig Dispense Refill   albuterol (VENTOLIN HFA) 108 (90 Base) MCG/ACT inhaler Inhale 2 puffs into the lungs every 4 (four) hours as needed for wheezing or shortness of breath. 1 each 0   amphetamine-dextroamphetamine (ADDERALL XR) 30 MG 24 hr capsule Take 30 mg by mouth every morning.     diphenhydrAMINE HCl, Sleep, (SLEEP-AID) 50 MG CAPS Take 100 mg by mouth at bedtime as needed (sleep).     doxylamine, Sleep, (UNISOM)  25 MG tablet Take 25 mg by mouth at bedtime as needed for sleep.     magnesium oxide (MAG-OX) 400 (240 Mg) MG tablet Take 400 mg by mouth daily.     Melatonin 10-10 MG TBCR Take 10 mg by mouth daily.      Lab Results:  Results for orders placed or performed during the hospital encounter of 07/22/23 (from the past 48 hour(s))  Basic metabolic panel     Status: Abnormal   Collection Time: 07/22/23  6:22 AM  Result Value Ref Range   Sodium 139 135 - 145 mmol/L   Potassium 3.5 3.5 - 5.1 mmol/L   Chloride 101 98 - 111 mmol/L   CO2 25 22 - 32 mmol/L   Glucose, Bld 123 (H) 70 - 99 mg/dL    Comment: Glucose reference range applies only to samples taken after fasting for at least 8 hours.   BUN 7 6 - 20 mg/dL   Creatinine, Ser 4.09 0.61 - 1.24 mg/dL   Calcium 9.5 8.9 - 81.1 mg/dL   GFR, Estimated >91 >47 mL/min    Comment: (NOTE) Calculated using the CKD-EPI Creatinine Equation (2021)    Anion gap 13 5 - 15    Comment: Performed at [facility], [address] [place], [place] [address]  CBC     Status: None   Collection Time: 07/22/23  6:22 AM  Result Value Ref Range   WBC 7.4 4.0 - 10.5 K/uL   RBC 4.67 4.22 - 5.81 MIL/uL   Hemoglobin 14.0 13.0 - 17.0 g/dL   HCT 21.3 08.6 - 57.8 %   MCV 92.1 80.0 - 100.0 fL   MCH 30.0 26.0 - 34.0 pg   MCHC 32.6 30.0 - 36.0 g/dL   RDW 46.9 62.9 - 52.8 %   Platelets 304 150 - 400 K/uL   nRBC 0.0 0.0 - 0.2 %     Comment: Performed at [facility], [address] [place], [place] [address]  Troponin I (High Sensitivity)     Status: None   Collection Time: 07/22/23  6:22 AM  Result Value Ref Range   Troponin I (High Sensitivity) 3 <18 ng/L    Comment: (NOTE) Elevated high sensitivity troponin I (hsTnI) values and significant  changes across serial measurements may suggest ACS but many other  chronic and acute conditions are known to elevate hsTnI results.  Refer to the "Links" section for chest pain algorithms and additional  guidance. Performed at [facility], [address], [place], [place] [address]   Rapid urine drug screen (hospital performed)     Status: Abnormal   Collection Time: 07/22/23  8:16 AM  Result Value Ref Range   Opiates NONE DETECTED NONE DETECTED   Cocaine NONE DETECTED NONE DETECTED   Benzodiazepines NONE DETECTED NONE DETECTED   Amphetamines POSITIVE (A) NONE DETECTED   Tetrahydrocannabinol POSITIVE (A) NONE DETECTED   Barbiturates NONE DETECTED NONE DETECTED    Comment: (NOTE) DRUG SCREEN FOR MEDICAL PURPOSES ONLY.  IF CONFIRMATION IS NEEDED FOR ANY PURPOSE, NOTIFY LAB WITHIN 5 DAYS.  LOWEST DETECTABLE LIMITS FOR URINE DRUG SCREEN Drug Class                     Cutoff (ng/mL) Amphetamine and metabolites    1000 Barbiturate and metabolites    200 Benzodiazepine                 200 Opiates and metabolites  300 Cocaine and metabolites        300 THC                            50 Performed at [facility], [address] [place], [place] [address]   Urinalysis, Routine w reflex microscopic -Urine, Clean Catch     Status: None   Collection Time: 07/22/23  8:16 AM  Result Value Ref Range   Color, Urine YELLOW YELLOW   APPearance CLEAR CLEAR   Specific Gravity, Urine 1.006 1.005 - 1.030   pH 7.0 5.0 - 8.0   Glucose, UA NEGATIVE NEGATIVE mg/dL   Hgb urine dipstick NEGATIVE NEGATIVE   Bilirubin Urine NEGATIVE NEGATIVE   Ketones, ur  NEGATIVE NEGATIVE mg/dL   Protein, ur NEGATIVE NEGATIVE mg/dL   Nitrite NEGATIVE NEGATIVE   Leukocytes,Ua NEGATIVE NEGATIVE    Comment: Performed at [facility], [address] [place], [place] [address]  Acetaminophen level     Status: Abnormal   Collection Time: 07/22/23  8:17 AM  Result Value Ref Range   Acetaminophen (Tylenol), Serum <10 (L) 10 - 30 ug/mL    Comment: (NOTE) Therapeutic concentrations vary significantly. A range of 10-30 ug/mL  may be an effective concentration for many patients. However, some  are best treated at concentrations outside of this range. Acetaminophen concentrations >150 ug/mL at 4 hours after ingestion  and >50 ug/mL at 12 hours after ingestion are often associated with  toxic reactions.  Performed at [facility], [address] [place], [place] [address]   Salicylate level     Status: Abnormal   Collection Time: 07/22/23  8:17 AM  Result Value Ref Range   Salicylate Lvl <7.0 (L) 7.0 - 30.0 mg/dL    Comment: Performed at [facility], [address] [place], [place] [address]  Ethanol     Status: None   Collection Time: 07/22/23  8:17 AM  Result Value Ref Range   Alcohol, Ethyl (B) <10 <10 mg/dL    Comment: (NOTE) Lowest detectable limit for serum alcohol is 10 mg/dL.  For medical purposes only. Performed at [facility], [address] [place], [place] [address]   Troponin I (High Sensitivity)     Status: None   Collection Time: 07/22/23  8:17 AM  Result Value Ref Range   Troponin I (High Sensitivity) 3 <18 ng/L    Comment: (NOTE) Elevated high sensitivity troponin I (hsTnI) values and significant  changes across serial measurements may suggest ACS but many other  chronic and acute conditions are known to elevate hsTnI results.  Refer to the "Links" section for chest pain algorithms and additional  guidance. Performed at [facility], [address] [place], [place] [address]   TSH     Status:  None   Collection Time: 07/22/23  8:17 AM  Result Value Ref Range   TSH 1.781 0.350 - 4.500 uIU/mL    Comment: Performed by a 3rd Generation assay with a functional sensitivity of <=0.01 uIU/mL. Performed at [facility], [address] [place], [place] [address]     Blood Alcohol level:  Lab Results  Component Value Date   [facility] <10 07/22/2023    Physical Findings:  CIWA:    COWS:      Psychiatric Specialty Exam:  Presentation  General Appearance:  Fairly Groomed  Eye Contact: Fair  Speech:  Clear and Coherent  Speech Volume: Normal  Handedness: Right   Mood and Affect  Mood: Anxious  Affect: Flat   Thought Process  Thought Processes: Goal Directed  Descriptions of Associations:Intact  Orientation:Full (Time, Place and Person)  Thought Content:Illogical; Paranoid Ideation  History of Schizophrenia/Schizoaffective disorder:No  Duration of Psychotic Symptoms:Greater than six months  Hallucinations:Hallucinations: Auditory  Ideas of Reference:Paranoia; Delusions  Suicidal Thoughts:Suicidal Thoughts: No  Homicidal Thoughts:Homicidal Thoughts: No   Sensorium  Memory: Immediate Fair; Recent Fair  Judgment: Poor  Insight: Lacking   Executive Functions  Concentration: Fair  Attention Span: Fair  Recall: Fair  Fund of Knowledge: Fair  Language: Fair   Psychomotor Activity  Psychomotor Activity: Psychomotor Activity: Normal   Assets  Assets: Desire for Improvement; Resilience; Social Support; Housing   Sleep  Sleep: Sleep: Fair Number of Hours of Sleep: 4    Physical Exam: Physical Exam Neurological:     Mental Status: He is alert and oriented to person, place, and time.  Psychiatric:        Attention and Perception: Attention normal.        Mood and Affect: Mood is anxious.        Speech: Speech normal.        Behavior: Behavior is withdrawn.        Thought Content: Thought content is paranoid.     Review of Systems  Psychiatric/Behavioral:  Positive for substance abuse.        Delusions, paranoia  All other systems reviewed and are negative.  Blood pressure 118/67, pulse 86, temperature 98.5 F (36.9 C), temperature source Oral, resp. rate 20, height 5\' 6"  (1.676 m), weight 72.6 kg, SpO2 100%. Body mass index is 25.82 kg/m.   Medical Decision Making: Pt case reviewed and discussed with Dr. Lucianne Muss. Will continue to recommend IVC and IP at this time. Based on collateral, I do think substance induced psychosis can be ruled out.   Problem 1: psychosis, unspecified - Risperidone 1 mg BID   Eligha Bridegroom, NP 07/23/2023, 12:07 PM

## 2023-07-23 NOTE — Progress Notes (Addendum)
LCSW Progress Note  [id number]   Antonio Cuevas  07/23/2023  9:31 PM    Inpatient Behavioral Health Placement  Pt meets inpatient criteria per [facility]. There are no available beds within [facility] system per [facility] Danika Riley,RN. Referral was sent to the following facilities;   Destination  Service Provider Address Phone [facility]  [address] [place] [address] [phone]  [facility] Health Patient Placement  [facility], [place] [phone]  [facility]  [address] [place], [place] [address] [phone]  [facility]  [address] [place] [address] [phone]  [facility]  [address] [place] [address] [phone]  [facility] [address]  [place] [address] [phone]  [facility]  [address] [place], [place] [address] [phone]  [facility]  [address] [place], [place] [address] [phone]  [facility]  [address] [place] [address] [phone]  [facility]  [address] [place] [address] [phone]  [facility]  [address] [place] [address] [phone]  [facility]  [address], [place] [address] [phone]  [facility]  [address] [place] [address] [phone]  [facility]  [address] [place] [address] [phone]  [facility] Inpatient [place]  [place] [phone]  [facility]  [address] [place] [address] [phone]  [facility] [place]  [address] [place], [place] [address] [phone]  [facility]  [address] [place] [address] [phone]  [facility]  [address], [place] [address] [phone]  [facility] Behavioral Health  [place] [phone]  [facility]  [address] [place], [place] [phone]  [facility]  [address] [place] [address] [phone]  [facility]  [address] [place] [address] [phone]  [facility]  [address], [place] [address] [phone]  [facility]  [address], [place] [address] [phone]  [facility]  [address], [place] [address] [phone]  [facility]  [address] [place] [address] [phone]    Situation ongoing,  CSW will follow up.    Maryjean Ka, MSW, [facility] 07/23/2023 9:31 PM

## 2023-07-23 NOTE — ED Notes (Signed)
Ivc current

## 2023-07-23 NOTE — Progress Notes (Signed)
Identified barrier to [facility] placement: No payor source/ insurance provider.   Maryjean Ka, MSW, [facility] 07/23/2023 10:56 PM

## 2023-07-23 NOTE — ED Notes (Signed)
Allowing this time only for mother and sister to have a 3rd person with them d/t developmental delays and 3rd person is under their care. Explained to family that this is a 1 time exception. Pt's sister verbalized understanding. Will monitor pt behavior for any escalation.

## 2023-07-23 NOTE — ED Notes (Signed)
Pt agitated and looking around. Pt talking about leaving and pacing.

## 2023-07-23 NOTE — ED Notes (Signed)
D/t pt escalating agitation w/ family at bedside, visitation being cut short so pt can calm down. Family and pt resistant to RN decision for what is best for pt at this time. Pt remains agitated.

## 2023-07-23 NOTE — ED Notes (Addendum)
During visitation, pt's mother making statements of, "I know you, they don't". Pt's mother making statements that turn pt against staff. Visitation to be revoked for pt's mother d/t pt escalates every time she is here. Mother visitation is non-therapeutic. Pt's sister is supportive and does not escalate pt while visiting. Security notified of visitation restriction per nursing judgement.

## 2023-07-23 NOTE — ED Notes (Signed)
Pt's sister visiting at this time

## 2023-07-23 NOTE — Progress Notes (Signed)
LCSW Progress Note  [id number]   JEFFORY SNELGROVE  07/23/2023  1:33 PM  Description:   Inpatient Psychiatric Referral  Patient was recommended inpatient per Eligha Bridegroom, NP. There [facility] beds at [facility], per [facility] [place] Rona Ravens, RN. Patient was referred to the following out of network facilities:     [facility] Provider Address Phone Fax  [facility]  [address] [place] [address] [phone]  [facility] Patient Placement  [facility], [place] [phone]  [facility]  [address] [place] [address] [phone]  [facility]  [id number] [place] [address] [phone]  [facility]  [address], [place] [address] [phone]  [facility]  [address] [place] [address] [phone]  [facility]  [address] [place] [address] [phone]  [facility]  [address] [place] [address] [phone]  [facility] [address]  [place] [address] [phone]  [facility]  [address] [place], [place] [address] [phone]  [facility]  [address] [place], [place] [address] [phone]  [facility]  [place] [phone]  [facility]  [address] [address] [phone]  [facility]  [address] [place], [place] [address] [phone]  [facility]  [address] [place] [address] [phone]  [facility]  [address], [place] [address] [phone]  [facility]  [address], [place] [address] [phone]  [facility]  [address] [place] [address] [phone]  [facility]  [address], [place] [address] [phone]  [facility]  [address] [place], [place] [address] [phone]  [facility] [place] [phone]  [facility]  [address] [place] [address] [phone]  [facility] Perinatal and Eating Disorders  [address] [place] [address] [phone]  [facility]  [address] [place] [address] [phone]  [facility]iat[facility] Inpatient [facility]  [place] [phone]  [facility]  [address] [place] [address] [phone]  [facility]  [address], [place] [address] 8     Situation ongoing, CSW to continue following and update chart as more information becomes available.      [address] [place], [facility] 07/23/2023 1:33 PM

## 2023-07-23 NOTE — ED Notes (Signed)
Pt agitated and pushing against all limits. Pt repetitive w/ demands and asking constantly about the same things like talking to the doctor, wearing shoes, playing his xbox, etc. Informed pt and family of the rules and that if rules are bent, others will expect rules to be bent. Explained to pt's mother how the waiting for placement process works, pt's mother minimally verbalized understanding, whereas pt's sister verbalized full understanding of the process. Explained that pt can have a book brought in, but the purple RN will have to inspect it prior to pt being able to have the book. Understanding verbalized by family. Pt remains agitated. Will continue to monitor pt behavior for further escalation requiring prn administration.

## 2023-07-23 NOTE — ED Provider Notes (Signed)
Emergency Medicine Observation Re-evaluation Note  Antonio Cuevas is a 37 y.o. male, seen on rounds today.  Pt initially presented to the ED for complaints of Palpitations and Chest Pain (Headache) Currently, the patient is eating breakfast.  Physical Exam  BP 98/62 (BP Location: Left Leg)   Pulse 70   Temp 97.8 F (36.6 C) (Oral)   Resp 18   Ht 5\' 6"  (1.676 m)   Wt 72.6 kg   SpO2 100%   BMI 25.82 kg/m  Physical Exam General: no acute distress Lungs: normal effort Psych: no agitation  ED Course / MDM  EKG:EKG Interpretation Date/Time:  Sunday July 22 2023 07:24:12 EDT Ventricular Rate:  86 PR Interval:  124 QRS Duration:  83 QT Interval:  367 QTC Calculation: 439 R Axis:   58  Text Interpretation: Sinus rhythm ST elev, probable normal early repol pattern Confirmed by Anders Simmonds [phone]) on 07/22/2023 7:34:04 AM  I have reviewed the labs performed to date as well as medications administered while in observation.  Recent changes in the last 24 hours include prn and scheduled psychiatric medications.  Plan  Current plan is for psychiatric reassessment.    Pricilla Loveless, MD 07/23/23 [phone]

## 2023-07-24 DIAGNOSIS — F29 Unspecified psychosis not due to a substance or known physiological condition: Secondary | ICD-10-CM

## 2023-07-24 MED ORDER — NICOTINE 21 MG/24HR TD PT24
21.0000 mg | MEDICATED_PATCH | Freq: Every day | TRANSDERMAL | Status: DC
  Administered 2023-07-24 – 2023-07-25 (×2): 21 mg via TRANSDERMAL
  Filled 2023-07-24 (×3): qty 1

## 2023-07-24 MED ORDER — AMPHETAMINE-DEXTROAMPHET ER 10 MG PO CP24
30.0000 mg | ORAL_CAPSULE | Freq: Every day | ORAL | Status: DC
  Administered 2023-07-25: 30 mg via ORAL
  Filled 2023-07-24 (×2): qty 3

## 2023-07-24 MED ORDER — ALBUTEROL SULFATE HFA 108 (90 BASE) MCG/ACT IN AERS: 2.0000 | INHALATION_SPRAY | RESPIRATORY_TRACT | Status: DC | PRN

## 2023-07-24 NOTE — ED Notes (Signed)
Pt present at desk requesting to go vape. Voiced to pt that he can not leave unit to vape. Pt states "what am I, a prisioner?" This nurse voiced to pt he is not a prisoner but he is IVC'd at this time. Pt then states that nicotine helps him calm down. Voiced to pt return back to room. Sister with pt. Security present at desk.

## 2023-07-24 NOTE — ED Notes (Signed)
Mother updated on pt's current ED status. Mother expressed concerns about pt not receiving treatment. Voiced to mother that pt's information has been sent to facilities that can attend to pt's need via Child psychotherapist. Voiced to  mother that once a bed is available from accepting facility, social worker will inform nurse and pt will be transported to accepting facility. Voiced to mother that she will be informed when  pt is accepted to a facility. Mother request to speak with social worker and psy nurse practitioner and this nurse voiced to mother that message will be relayed to them both. Mother states appreciation for update, call ended.

## 2023-07-24 NOTE — ED Notes (Signed)
Pt present to desk asking why he could not have a pencil. Voiced to pt that it hospital policy to not have sharp objects on unit. Pt states he would not harm himself or others. Voiced to pt that policy is for safety. Pt voiced understanding, ambulating back to room.

## 2023-07-24 NOTE — ED Notes (Signed)
Sister present to room with notes written on pad.

## 2023-07-24 NOTE — ED Provider Notes (Signed)
  Physical Exam  BP 107/80 (BP Location: Right Arm)   Pulse 95   Temp 98.4 F (36.9 C) (Oral)   Resp 20   Ht 5\' 6"  (1.676 m)   Wt 72.6 kg   SpO2 97%   BMI 25.82 kg/m   Physical Exam  Procedures  Procedures  ED Course / MDM   Clinical Course as of 07/24/23 4782  Wynelle Link Jul 22, 2023  1028 Medically cleared at this time for TTS eval [CP]    Clinical Course User Index [CP] Olene Floss, PA-C   Medical Decision Making Amount and/or Complexity of Data Reviewed Labs: ordered. Radiology: ordered.  Risk Prescription drug management.   Pending psychiatric placement.  In under IVC.  Reviewed psychiatry notes.  Patient was on ADHD medicine at home that has not been reordered.  Discussed with nursing and and they will discuss with psychiatrist at rounding today on whether this is a medication that he needs.       Benjiman Core, MD 07/24/23 [phone]

## 2023-07-24 NOTE — Progress Notes (Signed)
LCSW Progress Note  [id number]   Antonio Cuevas  07/24/2023  1:58 PM  Description:   Inpatient Psychiatric Referral  Patient was recommended inpatient per Vernard Gambles, NP. There [facility] beds at [facility], per [facility] Rona Ravens, RN. Patient was referred to the following out of network facilities:   [facility] Provider Address Phone Fax  [facility]  [address] [place] [address] [phone]  [facility]  [address] [place] [address] [phone]  [facility]  [phone] [address] [place] [address] [phone]  [facility]  [address] [place] [address] [phone]  [facility]  [address], [place] [address] [phone]  [facility] [address]  [place] [address] [phone]  [facility]  [address] [place], [place] [address] [phone]  [facility]  [address] [place] [address] [phone]  [facility]  [address] [place] [address] [phone]  [facility]  [address] [place] [address] [phone]  [facility]  [address] [place], [place] [address] [phone]  [facility]  [address] [place], [place] [address] [phone]  [facility] Perinatal and Eating Disorders  [address], [place] [address] [phone]  [facility] Inpatient EFAX  [facility] [phone]  [facility]  [address] [place] [address] [phone]  [facility]  [address] [place] [address]  [phone]  [facility] [place]  [address] [place], [place] [address] [phone]  [facility]  [address] [place] [address] [phone]  [facility] B[facility] Management Behavioral Health  [place] [phone]  [facility] EFAX  [address] [place], [place] [phone]  [facility]  [address] [place] [address] [phone]  [facility]  [address] [place] [address] [phone]  [facility]  [address], [place] [address] [phone]  [facility]  [address] [place] [address] [phone]    Situation ongoing, CSW to continue following and update chart as more information becomes available.    Cathie Beams, LCSW  07/24/2023 1:58 PM

## 2023-07-24 NOTE — ED Notes (Signed)
Sister aware that pt can only receive paperback book. Sister asked if pt could have charcoal chalk to draw with, voiced to sister that security and nurse taking care of pt will assess chalk for approval. Pt and sister aware.

## 2023-07-24 NOTE — ED Notes (Signed)
Sister updated on pt's ED status. Sister informed that pt's mother is prohibited from visiting pt until further notice. Sister ask did mother know that she was being prohibited from visiting pt and this nurse informed sister that it is unknown whether pt's mother is aware since the decision was made yesterday. This nurse informed sister, that pt was noted agitated when mother was present. Voiced to sister that it was noted that the mother was nontherapeutic at the time. Voiced to sister that this nurse will reach out to psy nurse practitioner to see if mother can come visit the pt.

## 2023-07-24 NOTE — Progress Notes (Signed)
[facility] ED Progress Note  07/24/2023 1:28 PM Antonio Cuevas  MRN:  [id number]   Subjective:   Patient seen at [facility], ED for face-to-face psychiatric reevaluation.  Patient is agitated and expresses being upset that he is being held against his will under IVC and being forced to remain in the hospital.  Patient has rapid and pressured speech talking about nothing is wrong with him, this is a legal and inhumane, and if he is to stay he needs his vape, cell phone, and to go outside.  He should really struggle to participate in conversation today.  He was circumstantial, preservating on how everyone is lying and there is nothing mentally wrong with him, and constantly threatening elopement to this provider. Any time I attempted to have a conversation and answer the patients questions he would interrupt me, and tell me my answers were incorrect. Eventually, it was realized the assessment was not making any progress due to patient being unable to accept the answers that were given to him.   I essentially mentioned a few details his family are concerned about. Yesterday after a very lengthy phone call with his mother and sister, they disclosed how the patient has been paranoid, delusions the government is controling his family and trying to control him, and how in July he had a "mental breakdown" over his severe paranoia and self harmed by cutting his arm. Patient became etremely irritable, yelling "I refuse to talk to you about these things you better get me home right now."   Patient refused to engage or speak with me about possible said delusions. Considering his reaction when mentioned, it appears these could be true experiences. The assessment was ended due to patient's increased agitation.   Will continue Risperidone 1 mg BID.   Principal Problem: Psychosis (HCC) Diagnosis:  Principal Problem:   Psychosis [facility])   ED Assessment Time Calculation: Start Time: 1300 Stop Time: 1320 Total  Time in Minutes (Assessment Completion): 20   Past Psychiatric History:  none  [place] Scale:  Flowsheet Row ED from 07/22/2023 in [facility] Emergency Department at [facility] ED from [facility]/19/2022 in [facility] RISK CATEGORY No Risk No Risk       Past Medical History: No past medical history on file. No past surgical history on file. Family History: No family history on file. Family Psychiatric  History:  Social History:  Social History   Substance and Sexual Activity  Alcohol Use None     Social History   Substance and Sexual Activity  Drug Use Not on file    Social History   Socioeconomic History   Marital status: Single    Spouse name: Not on file   Number of children: Not on file   Years of education: Not on file   Highest education level: Not on file  Occupational History   Not on file  Tobacco Use   Smoking status: Former    Types: Cigarettes   Smokeless tobacco: Never  Vaping Use   Vaping status: Every Day  Substance and Sexual Activity   Alcohol use: Not on file   Drug use: Not on file   Sexual activity: Not on file  Other Topics Concern   Not on file  Social History Narrative   Not on file   Social Determinants of Health   Financial Resource Strain: Not on File (02/09/2022)   Received from [employer]  Financial Resource Strain: 0  Food Insecurity: Not on File (07/19/2023)   Received from [employer]    Food: 0  Transportation Needs: Not on File (02/09/2022)   Received from [employer] Needs    Transportation: 0  Physical Activity: Not on File (02/09/2022)   Received from [facility]   Physical Activity    Physical Activity: 0  Stress: Not on File (02/09/2022)   [facility]   Stress    Stress: 0  Social Connections: Not on File (07/07/2023)   Received from [employer]   Social Connections    Connectedness: 0    Sleep: Fair  Appetite:   Good  Current Medications: Current Facility-Administered Medications  Medication Dose Route Frequency Provider Last Rate Last Admin   albuterol (VENTOLIN HFA) 108 (90 Base) MCG/ACT inhaler 2 puff  2 puff Inhalation Q4H PRN Benjiman Core, MD       amphetamine-dextroamphetamine (ADDERALL XR) 24 hr capsule 30 mg  30 mg Oral Daily Eligha Bridegroom, NP       hydrOXYzine (ATARAX) tablet 25 mg  25 mg Oral TID PRN Chales Abrahams, NP       OLANZapine zydis (ZYPREXA) disintegrating tablet 10 mg  10 mg Oral Q8H PRN Eligha Bridegroom, NP       And   LORazepam (ATIVAN) tablet 2 mg  2 mg Oral Q6H PRN Eligha Bridegroom, NP   2 mg at 07/23/23 1248   And   ziprasidone (GEODON) injection 20 mg  20 mg Intramuscular Q12H PRN Eligha Bridegroom, NP       nicotine (NICODERM CQ - dosed in mg/24 hours) patch 21 mg  21 mg Transdermal Daily Eligha Bridegroom, NP       risperiDONE (RISPERDAL M-TABS) disintegrating tablet 1 mg  1 mg Oral BID Eligha Bridegroom, NP   1 mg at 07/24/23 1008   Current Outpatient Medications  Medication Sig Dispense Refill   albuterol (VENTOLIN HFA) 108 (90 Base) MCG/ACT inhaler Inhale 2 puffs into the lungs every 4 (four) hours as needed for wheezing or shortness of breath. 1 each 0   amphetamine-dextroamphetamine (ADDERALL XR) 30 MG 24 hr capsule Take 30 mg by mouth every morning.     diphenhydrAMINE HCl, Sleep, (SLEEP-AID) 50 MG CAPS Take 100 mg by mouth at bedtime as needed (sleep).     doxylamine, Sleep, (UNISOM) 25 MG tablet Take 25 mg by mouth at bedtime as needed for sleep.     magnesium oxide (MAG-OX) 400 (240 Mg) MG tablet Take 400 mg by mouth daily.     Melatonin 10-10 MG TBCR Take 10 mg by mouth daily.      Lab Results: No results found for this or any previous visit (from the past 48 hour(s)).  Blood Alcohol level:  Lab Results  Component Value Date   ETH <10 07/22/2023     Musculoskeletal: Strength & Muscle Tone: within normal limits Gait & Station: normal Patient  leans: N/A  Psychiatric Specialty Exam:  Presentation  General Appearance:  Appropriate for Environment  Eye Contact: Fair  Speech: Clear and Coherent  Speech Volume: Normal  Handedness: Right   Mood and Affect  Mood: Anxious; Irritable  Affect: Congruent   Thought Process  Thought Processes: Goal Directed  Descriptions of Associations:Circumstantial  Orientation:Full (Time, Place and Person)  Thought Content:Paranoid Ideation; Perseveration; Illogical  History of Schizophrenia/Schizoaffective disorder:No  Duration of Psychotic Symptoms:Greater than six months  Hallucinations:Hallucinations: None  Ideas of Reference:Delusions; Paranoia  Suicidal  Thoughts:Suicidal Thoughts: No  Homicidal Thoughts:Homicidal Thoughts: No   Sensorium  Memory: Immediate Fair; Recent Fair  Judgment: Poor  Insight: Lacking   Executive Functions  Concentration: Fair  Attention Span: Fair  Recall: [employer] of Knowledge: Fair  Language: Fair   Psychomotor Activity  Psychomotor Activity: Psychomotor Activity: Restlessness   Assets  Assets: Desire for Improvement; Physical Health; Housing; Social Support   Sleep  Sleep: Sleep: Fair    Physical Exam: Physical Exam Neurological:     Mental Status: He is alert and oriented to person, place, and time.  Psychiatric:        Attention and Perception: Attention normal.        Mood and Affect: Affect is labile and angry.        Speech: Speech is rapid and pressured.        Behavior: Behavior is agitated.        Thought Content: Thought content is delusional.    Review of Systems  Psychiatric/Behavioral:         Agitated, delusional, paranoid  All other systems reviewed and are negative.  Blood pressure 107/80, pulse 95, temperature 98.4 F (36.9 C), temperature source Oral, resp. rate 20, height 5\' 6"  (1.676 m), weight 72.6 kg, SpO2 97%. Body mass index is 25.82 kg/m.   Medical  Decision Making: Patient case reviewed and discussed with Dr. Lucianne Muss. Will continue to recommend IVC and inpatient psychiatric admission.    Eligha Bridegroom, NP 07/24/2023, 1:28 PM

## 2023-07-24 NOTE — ED Notes (Signed)
IVC CURRENT

## 2023-07-25 NOTE — ED Notes (Signed)
Report called to Sherry, RN.

## 2023-07-25 NOTE — ED Provider Notes (Signed)
Patient accepted to [facility], MD 07/25/23 [phone]

## 2023-07-25 NOTE — ED Notes (Signed)
Pt's mother is asking to see the pt. She is aware that she has been banned from seeing the pt. Explained that he is getting ready to be transferred to a facility and they can re-evaluate. Also told his provider about this call. Teresja  [phone]

## 2023-07-25 NOTE — ED Notes (Signed)
IVC documents checked by YS.  Informed the nurse on duty that the paperwork is set to expire. She is going to check with the doctor to verify if patient is to remain IVC'd.

## 2023-07-25 NOTE — Progress Notes (Signed)
Pt has been accepted to [facility] TODAY 07/25/2023. Bed assignment: 5 West  Pt meets inpatient criteria per Arsenio Loader, NP  Attending Physician will be Joylene Igo, AGPCNP  Report can be called to: [phone] (please wait to call report until transport has left with pt)  Pt can arrive anytime today; bed is ready now  Care Team Notified: Arsenio Loader, NP and Renne Crigler, RN  Cathie Beams, MSW, LCSW  07/25/2023 8:56 AM

## 2023-07-25 NOTE — ED Notes (Signed)
Report to Laura,Rn  

## 2023-07-25 NOTE — ED Notes (Signed)
Report to Laura, RN

## 2023-07-25 NOTE — Progress Notes (Addendum)
LCSW Progress Note  [id number]   Antonio Cuevas  07/25/2023  12:18 AM    Inpatient Behavioral Health Placement  -Barrier to placement: NO PAYOR Source/ NO insurance.  Pt meets inpatient criteria per [facility]. There are no available beds within [facility] system per [facility] Rona Ravens, RN. Referral was sent to the following facilities;   Destination  Service Provider Address Phone [facility]  [address] [place] [address] [phone]  [facility] Patient Placement  [facility], [place] [phone]  [facility]  [address] [place], [place] [address] [phone]  [facility]  [address] [place] [address] [phone]  [facility]  [address] [place] [address] [phone]  [facility]  [address] [place] [address] [phone]  [facility] [address]  [place] [address] [phone]  [facility]  [address] [place], [place] [address] [phone]  [facility]  [address] [place], [place] [address] [phone]  [facility]  [address] [place] [address] [phone]  [facility]  [address], [place] [address] [phone]  [facility]  [address] [place] [address] [phone]  [facility]  [address] [place] [address] [phone]  [facility]  [address], [place] [address] [phone]  [facility]  [address] [place], [place] [address] [phone]  [facility]  [address] [place] [address] [phone]  [facility] Inpatient [place]  [place] [phone]  [facility]  [address] [place] [address] [phone]  [facility]  [address] [place] [address] [phone]  [facility]  [address] [place], [place] [address] [phone]  [facility]  [address] [place] [address] [phone]  [facility]  [address], [place] [address] [phone]  [facility]  [place] [phone]  [facility] EFAX  [address] [place], [place] [phone]  [facility]  [address], [place] [address] [phone]  [facility]  [address] [place] [address] [phone]  [facility]  [address], [place] [address] [phone]  [facility]  [address] [place] [address] [phone]  [facility]  [address] [place] [address] [phone]  [facility]  [address] [place] [address] [phone]  [facility]  [address], [place] [address] [phone]    Situation ongoing,  CSW will follow up.    Maryjean Ka, MSW, LCSWA 07/25/2023 12:18 AM

## 2023-07-25 NOTE — ED Provider Notes (Signed)
Emergency Medicine Observation Re-evaluation Note  Antonio Cuevas is a 37 y.o. male, seen on rounds today.  Pt initially presented to the ED for complaints of Palpitations and Chest Pain (Headache) Currently, the patient is eating breakfast.  Physical Exam  BP 130/76 (BP Location: Left Arm)   Pulse 65   Temp 97.8 F (36.6 C) (Oral)   Resp 15   Ht 5\' 6"  (1.676 m)   Wt 72.6 kg   SpO2 100%   BMI 25.82 kg/m  Physical Exam General: no acute distress Lungs: normal effort Psych: no agitation  ED Course / MDM  EKG:EKG Interpretation Date/Time:  Sunday July 22 2023 07:24:12 EDT Ventricular Rate:  86 PR Interval:  124 QRS Duration:  83 QT Interval:  367 QTC Calculation: 439 R Axis:   58  Text Interpretation: Sinus rhythm ST elev, probable normal early repol pattern Confirmed by Anders Simmonds [phone]) on 07/22/2023 7:34:04 AM  I have reviewed the labs performed to date as well as medications administered while in observation.  Recent changes in the last 24 hours include adderal being ordered.  Plan  Current plan is for inpatient psychiatric placement.    Pricilla Loveless, MD 07/25/23 [phone]

## 2024-05-06 ENCOUNTER — Other Ambulatory Visit: Payer: Self-pay | Admitting: Family Medicine

## 2024-05-06 ENCOUNTER — Ambulatory Visit
Admission: RE | Admit: 2024-05-06 | Discharge: 2024-05-06 | Disposition: A | Discharge status: Home / Self Care | Source: Ambulatory Visit | Attending: Family Medicine | Admitting: Family Medicine

## 2024-05-06 DIAGNOSIS — T1490XA Injury, unspecified, initial encounter: Secondary | ICD-10-CM

## 2024-05-06 DIAGNOSIS — S62621A Displaced fracture of medial phalanx of left index finger, initial encounter for closed fracture: Secondary | ICD-10-CM | POA: Diagnosis not present

## 2024-05-19 DIAGNOSIS — M79645 Pain in left finger(s): Secondary | ICD-10-CM | POA: Diagnosis not present

## 2024-05-19 DIAGNOSIS — M79642 Pain in left hand: Secondary | ICD-10-CM | POA: Diagnosis not present

## 2024-10-27 NOTE — Progress Notes (Signed)
 " Psychiatric Initial Adult Assessment  Patient Identification: Antonio Cuevas MRN:  [id number] Date of Evaluation:  10/27/2024  Assessment:  Antonio Cuevas presents to the [facility] clinic for establishing care.  He has a psychiatric history of MDD and ADHD.  He was placed on Wellbutrin  a few months ago and Zoloft about a month ago by his PCP.  He says prior to being placed on these medications, he was reporting symptoms of persistent low mood, anhedonia, feelings of hopelessness, low energy, and insomnia that was ongoing for years.  Since being on this medication regimen, he states that his symptoms have improved significantly amidst his ongoing psychosocial stressors. 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  I discussed the option of either staying on both Wellbutrin  and Zoloft or to try to optimize Wellbutrin  before adding on Zoloft and he chose the latter.  We will increase Wellbutrin  to 300 mg to aid with his depression and anxiety.  Regarding ADHD, he states that he was diagnosed in childhood and was previously on a different medication although he cannot recall the name.  He has been on Adderall for years now and he feels that this has been vital in helping stabilize his symptoms of inattention.  I discussed that we can take over on prescribing his Adderall with the agreement of obtaining a UDS in the future along with completing a DIVA assessment for more robust documentation of his ADHD symptoms and he is agreeable. I discussed the adverse effects of concurrent use of illicit substances with stimulant medication.  Follow-up in 1 month.  Plan:  # ADHD - Continue Adderall XR 30 mg daily - Increase Wellbutrin  XL to 300 mg daily - UDS on 06/2023 shows (+) amph and THC - EKG shows tachycardia otherwise normal on 06/2023  - UDS next appointment - DIVA assessment next appointment  # MDD in  partial remission - Increase Wellbutrin  XL 150 mg daily - Continue hydroxyzine  10 mg nightly PRN for sleep  # Tobacco use disorder - Encourage cessation  Patient was given contact information for behavioral health clinic and was instructed to call 911 for emergencies.   Identifying Information: Antonio Cuevas is a 39 y.o. male with a history of ADHD and MDD who presents in person to [facility] for medication management of his ADHD and depression.    Subjective:  History of Present Illness:    Patient seen alone. He reports Dr. Roanna referred him to the clinic for some reason. He states he is currently on Wellbutrin  for a few months and he feels that the medications are doing well. He states that Zoloft was added one month ago and he feels that change was helpful. He denies currently having depression and anxiety.   He states his depression started in childhood. He states the depression was ongoing five years ago, denying a life stressor at that time. He feels that being unmedicated for so long and had difficulty getting to work and feeling persistently sad. He had difficulty finishing school. He notes having fair sleep, reporting 6-8 hours of sleep. He reports having pleasure in his hobbies since being on his medications. He feels the adderall help with energy and executive function. He notes enjoying reading and learning about Daoism. He denies feelings of hopelessness and guilt. He notes having good focus. He reports having good appetite. He denies ever  having SI. He states before he started Wellbutrin , he was feeling more depressed, less pleasure, less energy, and more irritable and anxious.   He reports having a little bit of anxiety in general like caring for his granddad, not having a job, and doctor, hospital. He feels that since being on his medications, his anxiety has been much improved.   He has been on Adderall for a few years now, stating this is for his  ADHD. He states his ADHD symptoms started in Elementary and was diagnoses as a child. He reports difficulty focusing, having to read things multiple times, having difficulty starting and finishing tasks. He had issues with motivation, stating having the thought he cannot. He reports having mental exhaustion and anxiety about these symptoms.   Patient denies current SI, HI, and AVH.    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 Roanna  Family Psychiatric History: denies  Social History:  Living: living between two places, staying with grandfather to care for him and at his house Occupation: unemployed, caring for his grandfather full time; previously worked at a database administrator at [employer] Relationship: single Children: denies Support: mother and sister Legal History: denies  Past Medical History: No past medical history on file. No past surgical history on file.  Family History: No family history on  file.  Social History   Socioeconomic History   Marital status: Single    Spouse name: Not on file   Number of children: Not on file   Years of education: Not on file   Highest education level: Not on file  Occupational History   Not on file  Tobacco Use   Smoking status: Former    Types: Cigarettes   Smokeless tobacco: Never  Vaping Use   Vaping status: Every Day  Substance and Sexual Activity   Alcohol use: Not on file   Drug use: Not on file   Sexual activity: Not on file  Other Topics Concern   Not on file  Social History Narrative   Not on file   Social Drivers of Health   Tobacco Use: Medium Risk (02/19/2022)   Patient History    Smoking Tobacco Use: Former    Smokeless Tobacco Use: Never    Passive Exposure: Not on file  Financial Resource Strain: Not on File (02/09/2022)   Received from [employer]    Financial Resource Strain: 0  Food Insecurity: Not on File (07/19/2023)   Received from [facility] Insecurity    Food: 0  Transportation Needs: Not on File (02/09/2022)  Received from [employer]: 0  Physical Activity: Not on File (02/09/2022)   Received from [facility]   Physical Activity    Physical Activity: 0  Stress: Not on File (02/09/2022)   Received from [facility]   Stress    Stress: 0  Social Connections: Not on File (07/07/2023)   Received from [facility]   Social Connections    Connectedness: 0  Depression (PHQ2-9): Not on file  Alcohol Screen: Not on file  Housing: Not on file  Utilities: Not on file  Health Literacy: Not on file    Allergies: Allergies[1]  Current Medications: Current Outpatient Medications  Medication Sig Dispense Refill   albuterol  (VENTOLIN  HFA) 108 (90 Base) MCG/ACT inhaler Inhale 2 puffs into the lungs every 4 (four) hours as needed for wheezing or shortness of breath. 1 each 0   amphetamine -dextroamphetamine  (ADDERALL XR) 30 MG 24 hr capsule Take 30 mg by mouth every  morning.     diphenhydrAMINE HCl, Sleep, (SLEEP-AID) 50 MG CAPS Take 100 mg by mouth at bedtime as needed (sleep).     doxylamine, Sleep, (UNISOM) 25 MG tablet Take 25 mg by mouth at bedtime as needed for sleep.     magnesium oxide (MAG-OX) 400 (240 Mg) MG tablet Take 400 mg by mouth daily.     Melatonin 10-10 MG TBCR Take 10 mg by mouth daily.     No current facility-administered medications for this visit.    Objective:  Psychiatric Specialty Exam General Appearance: appears at stated age, casually dressed and groomed   Behavior: pleasant and cooperative   Psychomotor Activity: no psychomotor agitation or retardation noted   Eye Contact: fair  Speech: normal amount, tone, volume and fluency    Mood: euthymic  Affect: congruent, pleasant and interactive   Thought Process: linear, goal directed, no circumstantial or tangential thought process noted, no racing thoughts or flight of ideas  Descriptions of Associations: intact   Thought Content Hallucinations: denies AH, VH , does not appear responding to stimuli  Delusions: no paranoia, delusions of control, grandeur, ideas of reference, thought broadcasting, and magical thinking  Suicidal Thoughts: denies SI, intention, plan  Homicidal Thoughts: denies HI, intention, plan   Alertness/Orientation: alert and fully oriented   Insight: fair Judgment: fair  Memory: intact   Executive Functions  Concentration: intact  Attention Span: fair  Recall: intact  Fund of Knowledge: fair   Physical Exam  General: Pleasant, well-appearing. No acute distress. Pulmonary: Normal effort. No wheezing or rales. Skin: No obvious rash or lesions. Neuro: A&Ox3.No focal deficit.   Review of Systems  No reported symptoms  Metabolic Disorder Labs: No results found for: HGBA1C, MPG No results found for: PROLACTIN No results found for: CHOL, TRIG, HDL, CHOLHDL, VLDL, LDLCALC Lab Results  Component Value Date   TSH  1.781 07/22/2023    Therapeutic Level Labs: No results found for: LITHIUM No results found for: CBMZ No results found for: VALPROATE  Screenings:  Flowsheet Row ED from 07/22/2023 in [facility] at [facility] UC from 02/19/2022 in [facility] at [facility] RISK CATEGORY No Risk No Risk    Collaboration of Care: Case discussed with attending, see attending's attestation for additional information.  Ismael Franco, MD PGY-3 Psychiatry Resident     [1]  Allergies Allergen Reactions   Penicillins     Other Reaction(s): childhood   "

## 2024-11-03 ENCOUNTER — Encounter (HOSPITAL_COMMUNITY): Payer: Self-pay | Admitting: Psychiatry

## 2024-11-03 ENCOUNTER — Ambulatory Visit (HOSPITAL_COMMUNITY): Payer: Self-pay | Admitting: Psychiatry

## 2024-11-03 VITALS — BP 119/81 | Wt 160.0 lb

## 2024-11-03 DIAGNOSIS — F909 Attention-deficit hyperactivity disorder, unspecified type: Secondary | ICD-10-CM | POA: Diagnosis not present

## 2024-11-03 DIAGNOSIS — F3340 Major depressive disorder, recurrent, in remission, unspecified: Secondary | ICD-10-CM | POA: Diagnosis not present

## 2024-11-03 MED ORDER — BUPROPION HCL ER (XL) 300 MG PO TB24: 300.0000 mg | ORAL_TABLET | Freq: Every day | ORAL | 0 refills | Status: AC

## 2024-11-03 MED ORDER — AMPHETAMINE-DEXTROAMPHET ER 30 MG PO CP24: 30.0000 mg | ORAL_CAPSULE | Freq: Every morning | ORAL | 0 refills | Status: AC

## 2024-11-03 NOTE — Patient Instructions (Signed)
 For [facility] residents who are uninsured or have Medicare/Medicaid:  Please come to [facility] (this facility) during walk in hours for appointment with psychiatrist for further medication management and for therapists for therapy.    Walk in hours are 8-11 AM Monday through Thursday for medication management.Child and adolescent psychiatrists are only available on Wednesdays and Thursdays during walk in hours.  Therapy walk in hours are Monday-Wednesday 8 AM-1PM.   It is first come, first -serve; it is best to arrive by 7:00 AM.   On Friday from 1 pm to 4 pm for therapy intake only. Please arrive by 12:00 pm as it is  first come, first -serve.    When you arrive please go upstairs for your appointment. If you are unsure of where to go, inform the front desk that you are here for a walk in appointment and they will assist you with directions upstairs.  Address:  [address] in [place], [address] Ph: [phone]

## 2024-11-04 NOTE — Addendum Note (Signed)
 Addended by: CARVIN CROCK on: 11/04/2024 07:59 AM   Modules accepted: Level of Service

## 2024-12-15 ENCOUNTER — Ambulatory Visit (HOSPITAL_COMMUNITY): Admitting: Psychiatry
